# Patient Record
Sex: Male | Born: 1995 | Race: White | Hispanic: No | Marital: Single | Smoking: Current every day smoker
Health system: Southern US, Community
[De-identification: ages and names within clinical notes are randomized; demographics above are authoritative.]

## PROBLEM LIST (undated history)

## (undated) DIAGNOSIS — F909 Attention-deficit hyperactivity disorder, unspecified type: Secondary | ICD-10-CM

## (undated) HISTORY — DX: Attention-deficit hyperactivity disorder, unspecified type: F90.9

---
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
## 2016-02-02 ENCOUNTER — Encounter (HOSPITAL_COMMUNITY): Payer: Self-pay

## 2016-02-02 ENCOUNTER — Emergency Department (HOSPITAL_COMMUNITY): Payer: Medicaid Other

## 2016-02-02 ENCOUNTER — Emergency Department (HOSPITAL_COMMUNITY)
Admission: EM | Admit: 2016-02-02 | Discharge: 2016-02-02 | Disposition: A | Discharge status: Home / Self Care | Payer: Medicaid Other | Attending: Emergency Medicine | Admitting: Emergency Medicine

## 2016-02-02 DIAGNOSIS — Y9389 Activity, other specified: Secondary | ICD-10-CM | POA: Diagnosis not present

## 2016-02-02 DIAGNOSIS — F17210 Nicotine dependence, cigarettes, uncomplicated: Secondary | ICD-10-CM | POA: Diagnosis not present

## 2016-02-02 DIAGNOSIS — Y92410 Unspecified street and highway as the place of occurrence of the external cause: Secondary | ICD-10-CM | POA: Insufficient documentation

## 2016-02-02 DIAGNOSIS — S161XXA Strain of muscle, fascia and tendon at neck level, initial encounter: Secondary | ICD-10-CM | POA: Insufficient documentation

## 2016-02-02 DIAGNOSIS — M545 Low back pain: Secondary | ICD-10-CM | POA: Diagnosis present

## 2016-02-02 DIAGNOSIS — Y999 Unspecified external cause status: Secondary | ICD-10-CM | POA: Insufficient documentation

## 2016-02-02 DIAGNOSIS — S39012A Strain of muscle, fascia and tendon of lower back, initial encounter: Secondary | ICD-10-CM | POA: Diagnosis not present

## 2016-02-02 MED ORDER — HYDROCODONE-ACETAMINOPHEN 5-325 MG PO TABS: ORAL_TABLET | ORAL | 0 refills | Status: DC

## 2016-02-02 MED ORDER — IBUPROFEN 800 MG PO TABS
800.0000 mg | ORAL_TABLET | Freq: Once | ORAL | Status: AC
Start: 2016-02-02 — End: 2016-02-02
  Administered 2016-02-02: 800 mg via ORAL
  Filled 2016-02-02: qty 1

## 2016-02-02 MED ORDER — METHOCARBAMOL 500 MG PO TABS: 500.0000 mg | ORAL_TABLET | Freq: Three times a day (TID) | ORAL | 0 refills | Status: DC

## 2016-02-02 MED ORDER — IBUPROFEN 800 MG PO TABS: 800.0000 mg | ORAL_TABLET | Freq: Three times a day (TID) | ORAL | 0 refills | Status: DC

## 2016-02-02 MED ORDER — HYDROCODONE-ACETAMINOPHEN 5-325 MG PO TABS
1.0000 | ORAL_TABLET | Freq: Once | ORAL | Status: AC
Start: 2016-02-02 — End: 2016-02-02
  Administered 2016-02-02: 1 via ORAL
  Filled 2016-02-02: qty 1
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
## 2016-02-02 NOTE — Discharge Instructions (Signed)
Apply ice packs on/off.  Follow-up with your doctor for recheck.  Return here for any worsening symptoms
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
## 2016-05-17 ENCOUNTER — Emergency Department (HOSPITAL_COMMUNITY): Payer: No Typology Code available for payment source

## 2016-05-17 ENCOUNTER — Encounter (HOSPITAL_COMMUNITY): Payer: Self-pay | Admitting: Emergency Medicine

## 2016-05-17 ENCOUNTER — Emergency Department (HOSPITAL_COMMUNITY)
Admission: EM | Admit: 2016-05-17 | Discharge: 2016-05-17 | Disposition: A | Discharge status: Home / Self Care | Payer: No Typology Code available for payment source | Attending: Emergency Medicine | Admitting: Emergency Medicine

## 2016-05-17 DIAGNOSIS — Y939 Activity, unspecified: Secondary | ICD-10-CM | POA: Diagnosis not present

## 2016-05-17 DIAGNOSIS — M25552 Pain in left hip: Secondary | ICD-10-CM | POA: Insufficient documentation

## 2016-05-17 DIAGNOSIS — Y999 Unspecified external cause status: Secondary | ICD-10-CM | POA: Insufficient documentation

## 2016-05-17 DIAGNOSIS — F17210 Nicotine dependence, cigarettes, uncomplicated: Secondary | ICD-10-CM | POA: Insufficient documentation

## 2016-05-17 DIAGNOSIS — R55 Syncope and collapse: Secondary | ICD-10-CM | POA: Diagnosis not present

## 2016-05-17 DIAGNOSIS — S80811A Abrasion, right lower leg, initial encounter: Secondary | ICD-10-CM | POA: Insufficient documentation

## 2016-05-17 DIAGNOSIS — Y92410 Unspecified street and highway as the place of occurrence of the external cause: Secondary | ICD-10-CM | POA: Insufficient documentation

## 2016-05-17 DIAGNOSIS — F909 Attention-deficit hyperactivity disorder, unspecified type: Secondary | ICD-10-CM | POA: Diagnosis not present

## 2016-05-17 LAB — BASIC METABOLIC PANEL
Anion gap: 7 (ref 5–15)
BUN: 13 mg/dL (ref 6–20)
CHLORIDE: 106 mmol/L (ref 101–111)
CO2: 26 mmol/L (ref 22–32)
CREATININE: 1.16 mg/dL (ref 0.61–1.24)
Calcium: 9.2 mg/dL (ref 8.9–10.3)
GFR calc Af Amer: >60 mL/min (ref >60)
GFR calc non Af Amer: >60 mL/min (ref >60)
Glucose, Bld: 100 mg/dL — ABNORMAL HIGH (ref 65–99)
Potassium: 3.7 mmol/L (ref 3.5–5.1)
SODIUM: 139 mmol/L (ref 135–145)

## 2016-05-17 LAB — ETHANOL: Alcohol, Ethyl (B): 35 mg/dL — ABNORMAL HIGH (ref <5)

## 2016-05-17 MED ORDER — SODIUM CHLORIDE 0.9 % IV BOLUS (SEPSIS)
1000.0000 mL | Freq: Once | INTRAVENOUS | Status: AC
  Administered 2016-05-17: 1000 mL via INTRAVENOUS
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
## 2016-05-17 NOTE — ED Notes (Signed)
Patient transported to CT 
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
## 2018-10-30 ENCOUNTER — Ambulatory Visit (INDEPENDENT_AMBULATORY_CARE_PROVIDER_SITE_OTHER): Payer: Self-pay | Admitting: Family Medicine

## 2018-10-30 ENCOUNTER — Telehealth: Payer: Self-pay | Admitting: *Deleted

## 2018-10-30 ENCOUNTER — Other Ambulatory Visit: Payer: Self-pay

## 2018-10-30 ENCOUNTER — Other Ambulatory Visit: Payer: Self-pay | Admitting: *Deleted

## 2018-10-30 DIAGNOSIS — B349 Viral infection, unspecified: Secondary | ICD-10-CM

## 2018-10-30 MED ORDER — FAMOTIDINE 40 MG PO TABS: 40.0000 mg | ORAL_TABLET | Freq: Every day | ORAL | 0 refills | Status: DC

## 2018-10-30 MED ORDER — PROMETHAZINE HCL 25 MG PO TABS: 25.0000 mg | ORAL_TABLET | Freq: Four times a day (QID) | ORAL | 0 refills | Status: AC | PRN

## 2018-10-30 MED ORDER — PANTOPRAZOLE SODIUM 40 MG PO TBEC: 40.0000 mg | DELAYED_RELEASE_TABLET | Freq: Every day | ORAL | 0 refills | Status: AC

## 2018-10-30 NOTE — Progress Notes (Signed)
   Subjective:    Patient ID: Carlos Leblanc, male    DOB: December 14, 1995, 23 y.o.   MRN: [id number]   Format - video  Patient present at home Provider present at office Consent for interaction obtained Coronavirus outbreak made virtual visit necessary   Emesis   This is a new problem. Episode onset: 2 days. Episode frequency: no vomiting since yesterday. The maximum temperature recorded prior to his arrival was 100.4 - 100.9 F. Associated symptoms include a fever and headaches. Treatments tried: ibuprofen.   Virtual Visit via Video Note  I connected with Carlos Leblanc on 10/30/18 at 10:00 AM EDT by a video enabled telemedicine application and verified that I am speaking with the correct person using two identifiers.   I discussed the limitations of evaluation and management by telemedicine and the availability of in person appointments. The patient expressed understanding and agreed to proceed.  History of Present Illness:    Observations/Objective:   Assessment and Plan:   Follow Up Instructions:    I discussed the assessment and treatment plan with the patient. The patient was provided an opportunity to ask questions and all were answered. The patient agreed with the plan and demonstrated an understanding of the instructions.   The patient was advised to call back or seek an in-person evaluation if the symptoms worsen or if the condition fails to improve as anticipated.  I provided * of non-face-to-face time during this encounter.  2 days duration of feeling poorly.  Several spells of vomiting.  No diarrhea.  Some headache.  Low-grade fever.  Today feeling quite a bit better though not 100%     Review of Systems  Constitutional: Positive for fever.  Gastrointestinal: Positive for vomiting.  Neurological: Positive for headaches.       Objective:   Physical Exam   Virtual visit     Assessment & Plan:  Impression probable viral syndrome.  Discussed.   Symptom care discussed.  Phenergan as needed.  Symptom care discussed.  Potential for coronavirus very low but patient encouraged to avoid unnecessary exposures.  Appropriate off work interval discussed

## 2018-10-30 NOTE — Telephone Encounter (Signed)
What other choices in the h2blocker category do we have?

## 2018-10-30 NOTE — Telephone Encounter (Signed)
Called Carlos Leblanc and he states its not any alternatives. Ranitidine, pepcid 40mg  and tagament not available right now. He does have pepcid 10mg  otc. Consult with dr Brett Canales. Change to protonix 40mg  one daily per dr Brett Canales. Verbal order given to Carlos Leblanc and med list updated.

## 2018-10-30 NOTE — Telephone Encounter (Signed)
Pt seen today. [facility] called and asked if famotidine (pepcid) 40mg  daily could be switched to something else due to med being unavailable at this time.

## 2019-07-31 ENCOUNTER — Encounter: Payer: Self-pay | Admitting: Family Medicine

## 2019-08-01 ENCOUNTER — Encounter: Payer: Self-pay | Admitting: Family Medicine
